# Patient Record
Sex: Female | Born: 1960 | Race: White | Hispanic: No | Marital: Married | Smoking: Never smoker
Health system: Southern US, Community
[De-identification: ages and names within clinical notes are randomized; demographics above are authoritative.]

## PROBLEM LIST (undated history)

## (undated) HISTORY — PX: BREAST BIOPSY: SHX20

## (undated) HISTORY — PX: ABDOMINAL HYSTERECTOMY: SHX81

---

## 2005-03-14 ENCOUNTER — Emergency Department: Payer: Self-pay | Admitting: Unknown Physician Specialty

## 2005-03-16 ENCOUNTER — Inpatient Hospital Stay: Payer: Self-pay | Admitting: Internal Medicine

## 2005-03-20 ENCOUNTER — Emergency Department (HOSPITAL_COMMUNITY): Admission: EM | Admit: 2005-03-20 | Discharge: 2005-03-20 | Payer: Self-pay | Admitting: Emergency Medicine

## 2005-03-24 ENCOUNTER — Encounter: Payer: Self-pay | Admitting: Internal Medicine

## 2005-03-31 ENCOUNTER — Encounter: Payer: Self-pay | Admitting: Internal Medicine

## 2011-05-29 ENCOUNTER — Ambulatory Visit: Payer: Self-pay | Admitting: Obstetrics & Gynecology

## 2011-06-17 ENCOUNTER — Ambulatory Visit: Payer: Self-pay | Admitting: Internal Medicine

## 2011-10-12 ENCOUNTER — Ambulatory Visit: Payer: Self-pay | Admitting: Internal Medicine

## 2015-01-06 ENCOUNTER — Ambulatory Visit: Payer: Self-pay | Admitting: Internal Medicine

## 2015-11-12 ENCOUNTER — Other Ambulatory Visit: Payer: Self-pay | Admitting: Family Medicine

## 2015-11-12 DIAGNOSIS — Z1231 Encounter for screening mammogram for malignant neoplasm of breast: Secondary | ICD-10-CM

## 2015-11-14 ENCOUNTER — Ambulatory Visit
Admission: RE | Admit: 2015-11-14 | Discharge: 2015-11-14 | Disposition: A | Discharge status: Home / Self Care | Payer: 59 | Source: Ambulatory Visit | Attending: Family Medicine | Admitting: Family Medicine

## 2015-11-14 DIAGNOSIS — Z1231 Encounter for screening mammogram for malignant neoplasm of breast: Secondary | ICD-10-CM | POA: Insufficient documentation

## 2017-05-25 ENCOUNTER — Other Ambulatory Visit: Payer: Self-pay | Admitting: Family Medicine

## 2017-05-25 DIAGNOSIS — Z1231 Encounter for screening mammogram for malignant neoplasm of breast: Secondary | ICD-10-CM

## 2017-05-28 ENCOUNTER — Ambulatory Visit
Admission: RE | Admit: 2017-05-28 | Discharge: 2017-05-28 | Disposition: A | Discharge status: Home / Self Care | Payer: 59 | Source: Ambulatory Visit | Attending: Family Medicine | Admitting: Family Medicine

## 2017-05-28 DIAGNOSIS — Z1231 Encounter for screening mammogram for malignant neoplasm of breast: Secondary | ICD-10-CM | POA: Diagnosis present

## 2018-01-16 ENCOUNTER — Other Ambulatory Visit: Payer: Self-pay

## 2018-01-16 ENCOUNTER — Ambulatory Visit
Admission: EM | Admit: 2018-01-16 | Discharge: 2018-01-16 | Disposition: A | Discharge status: Home / Self Care | Payer: 59 | Attending: Emergency Medicine | Admitting: Emergency Medicine

## 2018-01-16 DIAGNOSIS — N3001 Acute cystitis with hematuria: Secondary | ICD-10-CM

## 2018-01-16 DIAGNOSIS — R310 Gross hematuria: Secondary | ICD-10-CM

## 2018-01-16 LAB — URINALYSIS, COMPLETE (UACMP) WITH MICROSCOPIC
BILIRUBIN URINE: NEGATIVE
GLUCOSE, UA: NEGATIVE mg/dL
Ketones, ur: NEGATIVE mg/dL
Leukocytes, UA: NEGATIVE
NITRITE: NEGATIVE
PROTEIN: 30 mg/dL — AB
SPECIFIC GRAVITY, URINE: 1.025 (ref 1.005–1.030)
pH: 5 (ref 5.0–8.0)

## 2018-01-16 MED ORDER — FLUCONAZOLE 150 MG PO TABS: 150.0000 mg | ORAL_TABLET | Freq: Once | ORAL | 1 refills | Status: AC

## 2018-01-16 MED ORDER — CEPHALEXIN 500 MG PO CAPS: 500.0000 mg | ORAL_CAPSULE | Freq: Two times a day (BID) | ORAL | 0 refills | Status: DC

## 2018-01-16 NOTE — ED Triage Notes (Signed)
Pt reports she noticed low back pain earlier in the week. Today she noticed blood in her urine.

## 2018-01-16 NOTE — ED Provider Notes (Signed)
HPI  SUBJECTIVE:  Tammy Campbell is Campbell 57 y.o. female who presents with 6-8 episodes of gross hematuria starting this morning.  She states that she has hematuria each time she urinates.  She denies passing clots.  She states that she had odorous urine at the beginning of the week and had some bilateral low back pain several days ago, but this has since resolved.  She states that she drinks lots of sodas and juices and not much water.  She denies dysuria, urgency, frequency, cloudy urine.  No fevers, body aches, abdominal, flank, pelvic pain.  No vaginal bleeding, itching, odor, rash, discharge.  She has not had intercourse with her husband in months.  STDs are not Campbell concern today.  She denies epistaxis, melena, hematochezia, easy bruising.  No antibiotics in the past month.  No antipyretic in the past 6-8 hours.  No aggravating or alleviating factors.  She has not tried anything for this.  She has Campbell past medical history of UTIs and states that she had hematuria with that.  She has Campbell past medical history of vaginal yeast infections.  No history of pyelonephritis, nephrolithiasis, STI's, BV, anticoagulant, antiplatelet use, coagulopathies.  Family history negative for nephrolithiasis.  DGL:OVFIEPPMD:George, Tammy CrosbySionne A, MD   History reviewed. No pertinent past medical history.  Past Surgical History:  Procedure Laterality Date  . ABDOMINAL HYSTERECTOMY    . BREAST BIOPSY Left    2 bx-neg    Family History  Problem Relation Age of Onset  . Breast cancer Neg Hx     Social History   Tobacco Use  . Smoking status: Never Smoker  . Smokeless tobacco: Never Used  Substance Use Topics  . Alcohol use: Yes    Frequency: Never    Comment: social  . Drug use: No    No current facility-administered medications for this encounter.   Current Outpatient Medications:  .  cephALEXin (KEFLEX) 500 MG capsule, Take 1 capsule (500 mg total) by mouth 2 (two) times daily., Disp: 14 capsule, Rfl: 0 .   fluconazole (DIFLUCAN) 150 MG tablet, Take 1 tablet (150 mg total) by mouth once for 1 dose. 1 tab po x 1. May repeat in 72 hours if no improvement, Disp: 2 tablet, Rfl: 1  Allergies  Allergen Reactions  . Penicillins Rash     ROS  As noted in HPI.   Physical Exam  BP (!) 112/58 (BP Location: Left Arm)   Pulse 67   Temp 98.2 F (36.8 C) (Oral)   Resp 20   Ht 5\' 5"  (1.651 m)   Wt 205 lb (93 kg)   SpO2 100%   BMI 34.11 kg/m   Constitutional: Well developed, well nourished, no acute distress Eyes:  EOMI, conjunctiva normal bilaterally HENT: Normocephalic, atraumatic,mucus membranes moist Respiratory: Normal inspiratory effort Cardiovascular: Normal rate GI: nondistended.  Positive suprapubic tenderness.  No flank tenderness.  Active bowel sounds.  No guarding, rebound. Back: No CVAT skin: No rash, skin intact Musculoskeletal: no deformities Neurologic: Alert & oriented x 3, no focal neuro deficits Psychiatric: Speech and behavior appropriate   ED Course   Medications - No data to display  Orders Placed This Encounter  Procedures  . Urine culture    Standing Status:   Standing    Number of Occurrences:   1    Order Specific Question:   List patient's active antibiotics    Answer:   keflex    Order Specific Question:   Patient immune  status    Answer:   Normal  . Urinalysis, Complete w Microscopic    Standing Status:   Standing    Number of Occurrences:   1    Results for orders placed or performed during the hospital encounter of 01/16/18 (from the past 24 hour(s))  Urinalysis, Complete w Microscopic     Status: Abnormal   Collection Time: 01/16/18 11:05 AM  Result Value Ref Range   Color, Urine AMBER (Campbell) YELLOW   APPearance CLOUDY (Campbell) CLEAR   Specific Gravity, Urine 1.025 1.005 - 1.030   pH 5.0 5.0 - 8.0   Glucose, UA NEGATIVE NEGATIVE mg/dL   Hgb urine dipstick LARGE (Campbell) NEGATIVE   Bilirubin Urine NEGATIVE NEGATIVE   Ketones, ur NEGATIVE NEGATIVE  mg/dL   Protein, ur 30 (Campbell) NEGATIVE mg/dL   Nitrite NEGATIVE NEGATIVE   Leukocytes, UA NEGATIVE NEGATIVE   Squamous Epithelial / LPF 0-5 (Campbell) NONE SEEN   WBC, UA 0-5 0 - 5 WBC/hpf   RBC / HPF TOO NUMEROUS TO COUNT 0 - 5 RBC/hpf   Bacteria, UA FEW (Campbell) NONE SEEN   Budding Yeast MANY    No results found.  ED Clinical Impression  Gross hematuria  Acute cystitis with hematuria   ED Assessment/Plan   Large hematuria, some proteinuria, positive yeast.  Few bacteria, but negative nitrites and esterase.  This could be nephrolithiasis given that she had back pain several days ago, but it is since resolved so doubt obstructing nephrolithiasis.  We will treat her as if this is Campbell UTI with some Keflex and refer her to urology for Campbell comprehensive workup.  Dr. Alvester Morin on call.  Sending urine off for culture to confirm antibiotic choice.   Will also treat the yeast in her urine with Diflucan although she has no vaginal symptoms.  She states that she had an unknown rash to penicillin over 10 years ago.  She denies anaphylaxis, shortness of breath.  Discussed with her the small possibility of cross reactivity between penicillin and Keflex however she has agreed to try it.  Discussed labs,  MDM, plan and followup with patient. Discussed sn/sx that should prompt return to the ED. patient agrees with plan.   Meds ordered this encounter  Medications  . cephALEXin (KEFLEX) 500 MG capsule    Sig: Take 1 capsule (500 mg total) by mouth 2 (two) times daily.    Dispense:  14 capsule    Refill:  0  . fluconazole (DIFLUCAN) 150 MG tablet    Sig: Take 1 tablet (150 mg total) by mouth once for 1 dose. 1 tab po x 1. May repeat in 72 hours if no improvement    Dispense:  2 tablet    Refill:  1    *This clinic note was created using Scientist, clinical (histocompatibility and immunogenetics). Therefore, there may be occasional mistakes despite careful proofreading.   ?   Domenick Gong, MD 01/16/18 1212

## 2018-01-16 NOTE — Discharge Instructions (Signed)
We are going to treat this as if this is a urinary tract infection with Keflex.  I am also going to treat the yeast with Diflucan.  Take 1 pill today and you may repeat it in 72 hours if you are having vaginal itching, discharge.  You may take 600 mg ibuprofen with 1 g of Tylenol as needed for pain.  Follow-up with Dr. Alvester MorinBell in a week for a more comprehensive workup.  While this may be coming from a urinary tract infection we need to make sure that it is not a serious cause, such as kidney damage or bladder cancer.

## 2018-01-18 LAB — URINE CULTURE
Culture: <10000 — AB
SPECIAL REQUESTS: NORMAL

## 2018-07-19 ENCOUNTER — Other Ambulatory Visit: Payer: Self-pay | Admitting: Family Medicine

## 2018-07-19 DIAGNOSIS — Z1231 Encounter for screening mammogram for malignant neoplasm of breast: Secondary | ICD-10-CM

## 2018-07-20 ENCOUNTER — Ambulatory Visit
Admission: RE | Admit: 2018-07-20 | Discharge: 2018-07-20 | Disposition: A | Discharge status: Home / Self Care | Payer: BLUE CROSS/BLUE SHIELD | Source: Ambulatory Visit | Attending: Family Medicine | Admitting: Family Medicine

## 2018-07-20 ENCOUNTER — Encounter (INDEPENDENT_AMBULATORY_CARE_PROVIDER_SITE_OTHER): Payer: Self-pay

## 2018-07-20 DIAGNOSIS — Z1231 Encounter for screening mammogram for malignant neoplasm of breast: Secondary | ICD-10-CM | POA: Insufficient documentation

## 2018-08-10 ENCOUNTER — Encounter: Payer: Self-pay | Admitting: Anesthesiology

## 2018-08-18 ENCOUNTER — Ambulatory Visit: Payer: BLUE CROSS/BLUE SHIELD | Admitting: Anesthesiology

## 2018-08-18 ENCOUNTER — Ambulatory Visit
Admission: RE | Admit: 2018-08-18 | Discharge: 2018-08-18 | Disposition: A | Discharge status: Home / Self Care | Payer: BLUE CROSS/BLUE SHIELD | Source: Ambulatory Visit | Attending: Otolaryngology | Admitting: Otolaryngology

## 2018-08-18 ENCOUNTER — Encounter
Admission: RE | Disposition: A | Discharge status: Home / Self Care | Payer: Self-pay | Source: Ambulatory Visit | Attending: Otolaryngology

## 2018-08-18 DIAGNOSIS — Z6836 Body mass index (BMI) 36.0-36.9, adult: Secondary | ICD-10-CM | POA: Diagnosis not present

## 2018-08-18 DIAGNOSIS — J351 Hypertrophy of tonsils: Secondary | ICD-10-CM | POA: Diagnosis present

## 2018-08-18 DIAGNOSIS — J3501 Chronic tonsillitis: Secondary | ICD-10-CM | POA: Diagnosis not present

## 2018-08-18 HISTORY — PX: TONSILLECTOMY: SHX5217

## 2018-08-18 SURGERY — TONSILLECTOMY
Anesthesia: General | Site: Throat | Laterality: Bilateral

## 2018-08-18 MED ORDER — LIDOCAINE HCL (CARDIAC) PF 100 MG/5ML IV SOSY
PREFILLED_SYRINGE | INTRAVENOUS | Status: DC | PRN
  Administered 2018-08-18: 40 mg via INTRAVENOUS

## 2018-08-18 MED ORDER — ACETAMINOPHEN 10 MG/ML IV SOLN: 1000.0000 mg | Freq: Once | INTRAVENOUS | Status: DC

## 2018-08-18 MED ORDER — OXYCODONE HCL 5 MG/5ML PO SOLN
5.0000 mg | Freq: Once | ORAL | Status: AC | PRN
  Administered 2018-08-18: 5 mg via ORAL

## 2018-08-18 MED ORDER — SCOPOLAMINE 1 MG/3DAYS TD PT72
1.0000 | MEDICATED_PATCH | Freq: Once | TRANSDERMAL | Status: DC
  Administered 2018-08-18: 1.5 mg via TRANSDERMAL

## 2018-08-18 MED ORDER — OXYMETAZOLINE HCL 0.05 % NA SOLN
NASAL | Status: DC | PRN
  Administered 2018-08-18: 1 via TOPICAL

## 2018-08-18 MED ORDER — FENTANYL CITRATE (PF) 100 MCG/2ML IJ SOLN
25.0000 ug | INTRAMUSCULAR | Status: DC | PRN
  Administered 2018-08-18: 25 ug via INTRAVENOUS

## 2018-08-18 MED ORDER — SUCCINYLCHOLINE CHLORIDE 20 MG/ML IJ SOLN
INTRAMUSCULAR | Status: DC | PRN
  Administered 2018-08-18: 80 mg via INTRAVENOUS

## 2018-08-18 MED ORDER — ONDANSETRON HCL 4 MG PO TABS: 4.0000 mg | ORAL_TABLET | Freq: Three times a day (TID) | ORAL | 0 refills | Status: AC | PRN

## 2018-08-18 MED ORDER — MIDAZOLAM HCL 5 MG/5ML IJ SOLN
INTRAMUSCULAR | Status: DC | PRN
  Administered 2018-08-18: 2 mg via INTRAVENOUS

## 2018-08-18 MED ORDER — LACTATED RINGERS IV SOLN
10.0000 mL/h | INTRAVENOUS | Status: DC
  Administered 2018-08-18: 10 mL/h via INTRAVENOUS

## 2018-08-18 MED ORDER — PROMETHAZINE HCL 25 MG/ML IJ SOLN: 6.2500 mg | INTRAMUSCULAR | Status: DC | PRN

## 2018-08-18 MED ORDER — ONDANSETRON HCL 4 MG/2ML IJ SOLN
INTRAMUSCULAR | Status: DC | PRN
  Administered 2018-08-18: 4 mg via INTRAVENOUS

## 2018-08-18 MED ORDER — DEXAMETHASONE SODIUM PHOSPHATE 4 MG/ML IJ SOLN
INTRAMUSCULAR | Status: DC | PRN
  Administered 2018-08-18: 10 mg via INTRAVENOUS

## 2018-08-18 MED ORDER — PROPOFOL 10 MG/ML IV BOLUS
INTRAVENOUS | Status: DC | PRN
  Administered 2018-08-18: 130 mg via INTRAVENOUS

## 2018-08-18 MED ORDER — FENTANYL CITRATE (PF) 100 MCG/2ML IJ SOLN
INTRAMUSCULAR | Status: DC | PRN
  Administered 2018-08-18 (×2): 25 ug via INTRAVENOUS
  Administered 2018-08-18: 50 ug via INTRAVENOUS

## 2018-08-18 MED ORDER — GLYCOPYRROLATE 0.2 MG/ML IJ SOLN
INTRAMUSCULAR | Status: DC | PRN
  Administered 2018-08-18: 0.1 mg via INTRAVENOUS

## 2018-08-18 MED ORDER — ACETAMINOPHEN 10 MG/ML IV SOLN
1000.0000 mg | Freq: Once | INTRAVENOUS | Status: AC
  Administered 2018-08-18: 1000 mg via INTRAVENOUS

## 2018-08-18 MED ORDER — OXYCODONE HCL 5 MG PO TABS: 5.0000 mg | ORAL_TABLET | Freq: Once | ORAL | Status: AC | PRN

## 2018-08-18 MED ORDER — OXYCODONE HCL 5 MG/5ML PO SOLN: 5.0000 mg | ORAL | 0 refills | Status: AC | PRN

## 2018-08-18 MED ORDER — LIDOCAINE VISCOUS HCL 2 % MT SOLN: 10.0000 mL | Freq: Four times a day (QID) | OROMUCOSAL | 0 refills | Status: AC | PRN

## 2018-08-18 MED ORDER — BUPIVACAINE HCL (PF) 0.25 % IJ SOLN
INTRAMUSCULAR | Status: DC | PRN
  Administered 2018-08-18: 1 mL

## 2018-08-18 SURGICAL SUPPLY — 20 items
BLADE BOVIE TIP EXT 4 (BLADE) ×3 IMPLANT
CANISTER SUCT 1200ML W/VALVE (MISCELLANEOUS) ×3 IMPLANT
CATH ROBINSON RED A/P 10FR (CATHETERS) ×3 IMPLANT
COAG SUCT 10F 3.5MM HAND CTRL (MISCELLANEOUS) ×3 IMPLANT
ELECT REM PT RETURN 9FT ADLT (ELECTROSURGICAL) ×3
ELECTRODE REM PT RTRN 9FT ADLT (ELECTROSURGICAL) ×1 IMPLANT
GLOVE BIO SURGEON STRL SZ7.5 (GLOVE) ×5 IMPLANT
HANDLE SUCTION POOLE (INSTRUMENTS) ×1 IMPLANT
KIT TURNOVER KIT A (KITS) ×3 IMPLANT
NDL HYPO 25GX1X1/2 BEV (NEEDLE) ×1 IMPLANT
NEEDLE HYPO 25GX1X1/2 BEV (NEEDLE) ×3 IMPLANT
NS IRRIG 500ML POUR BTL (IV SOLUTION) ×3 IMPLANT
PACK TONSIL/ADENOIDS (PACKS) ×3 IMPLANT
PENCIL SMOKE EVACUATOR (MISCELLANEOUS) ×3 IMPLANT
SLEEVE SUCTION 125 (MISCELLANEOUS) ×3 IMPLANT
SOL ANTI-FOG 6CC FOG-OUT (MISCELLANEOUS) ×1 IMPLANT
SOL FOG-OUT ANTI-FOG 6CC (MISCELLANEOUS) ×2
STRAP BODY AND KNEE 60X3 (MISCELLANEOUS) ×5 IMPLANT
SUCTION POOLE HANDLE (INSTRUMENTS) ×3
SYR 5ML LL (SYRINGE) ×3 IMPLANT

## 2018-08-18 NOTE — H&P (Signed)
..  History and Physical paper copy reviewed and updated date of procedure and will be scanned into system.  Patient seen and examined.  

## 2018-08-18 NOTE — Anesthesia Procedure Notes (Signed)
Procedure Name: Intubation Date/Time: 08/18/2018 8:28 AM Performed by: Jimmy PicketAmyot, Aleeta Schmaltz, CRNA Pre-anesthesia Checklist: Patient identified, Emergency Drugs available, Suction available, Patient being monitored and Timeout performed Patient Re-evaluated:Patient Re-evaluated prior to induction Oxygen Delivery Method: Circle system utilized Preoxygenation: Pre-oxygenation with 100% oxygen Induction Type: IV induction Ventilation: Mask ventilation without difficulty Laryngoscope Size: Miller and 2 Grade View: Grade I Tube type: Oral Rae Tube size: 7.0 mm Number of attempts: 1 Placement Confirmation: ETT inserted through vocal cords under direct vision,  positive ETCO2 and breath sounds checked- equal and bilateral Tube secured with: Tape Dental Injury: Teeth and Oropharynx as per pre-operative assessment

## 2018-08-18 NOTE — Discharge Instructions (Signed)
T & A INSTRUCTION SHEET - [facility] °[facility] ° °CREIGHTON VAUGHT, MD °PAUL H. JUENGEL, MD  °P. [facility]ENNETT °CHAPMAN MCQUEEN, MD ° °[address] [place], [place] [address] TEL. [phone] °[address] [place] [address] [phone] ° °INFORMATION SHEET FOR A TONSILLECTOMY AND ADENDOIDECTOMY ° °About Your Tonsils and Adenoids ° The tonsils and adenoids are normal body tissues that are part of our immune system.  They normally help to protect us against diseases that may enter our mouth and nose.  However, sometimes the tonsils and/or adenoids become too large and obstruct our breathing, especially at night. °  ° If either of these things happen it helps to remove the tonsils and adenoids in order to become healthier. The operation to remove the tonsils and adenoids is called a tonsillectomy and adenoidectomy. ° °The Location of Your Tonsils and Adenoids ° The tonsils are located in the back of the throat on both side and sit in a cradle of muscles. The adenoids are located in the roof of the mouth, behind the nose, and closely associated with the opening of the Eustachian tube to the ear. ° °Surgery on Tonsils and Adenoids ° A tonsillectomy and adenoidectomy is a short operation which takes about thirty minutes.  This includes being put to sleep and being awakened.  Tonsillectomies and adenoidectomies are performed at [facility] and may require observation period in the recovery room prior to going home. ° °Following the Operation for a Tonsillectomy ° A cautery machine is used to control bleeding.  Bleeding from a tonsillectomy and adenoidectomy is minimal and postoperatively the risk of bleeding is approximately four percent, although this rarely life threatening. ° ° ° °After your tonsillectomy and adenoidectomy post-op care at home: ° °1. Our patients are able to go home the same day.  You may be given prescriptions for pain  medications and antibiotics, if indicated. °2. It is extremely important to remember that fluid intake is of utmost importance after a tonsillectomy.  The amount that you drink must be maintained in the postoperative period.  A good indication of whether a child is getting enough fluid is whether his/her urine output is constant.  As long as children are urinating or wetting their diaper every 6 - 8 hours this is usually enough fluid intake.   °3. Although rare, this is a risk of some bleeding in the first ten days after surgery.  This is usually occurs between day five and nine postoperatively.  This risk of bleeding is approximately four percent.  If you or your child should have any bleeding you should remain calm and notify our office or go directly to the Emergency Room at [facility] where they will contact us. Our doctors are available seven days a week for notification.  We recommend sitting up quietly in a chair, place an ice pack on the front of the neck and spitting out the blood gently until we are able to contact you.  Adults should gargle gently with ice water and this may help stop the bleeding.  If the bleeding does not stop after a short time, i.e. 10 to 15 minutes, or seems to be increasing again, please contact us or go to the hospital.   °4. It is common for the pain to be worse at 5 - 7 days postoperatively.  This occurs because the “scab” is peeling off and the mucous membrane (skin of   the throat) is growing back where the tonsils were.   °5. It is common for a low-grade fever, less than 102, during the first week after a tonsillectomy and adenoidectomy.  It is usually due to not drinking enough liquids, and we suggest your use liquid Tylenol or the pain medicine with Tylenol prescribed in order to keep your temperature below 102.  Please follow the directions on the back of the bottle. °6. Do not take aspirin or any products that contain aspirin such as Bufferin, Anacin,  Ecotrin, aspirin gum, Goodies, BC headache powders, etc., after a T&A because it can promote bleeding.  Please check with our office before administering any other medication that may been prescribed by other doctors during the two week post-operative period. °7. If you happen to look in the mirror or into your child’s mouth you will see white/gray patches on the back of the throat.  This is what a scab looks like in the mouth and is normal after having a T&A.  It will disappear once the tonsil area heals completely. However, it may cause a noticeable odor, and this too will disappear with time.     °8. You or your child may experience ear pain after having a T&A.  This is called referred pain and comes from the throat, but it is felt in the ears.  Ear pain is quite common and expected.  It will usually go away after ten days.  There is usually nothing wrong with the ears, and it is primarily due to the healing area stimulating the nerve to the ear that runs along the side of the throat.  Use either the prescribed pain medicine or Tylenol as needed.  °9. The throat tissues after a tonsillectomy are obviously sensitive.  Smoking around children who have had a tonsillectomy significantly increases the risk of bleeding.  DO NOT SMOKE!  ° °Scopolamine skin patches °REMOVE PATCH IN 72 HOURS AND WASH HANDS IMMEDIATELY °What is this medicine? °SCOPOLAMINE (skoe POL a meen) is used to prevent nausea and vomiting caused by motion sickness, anesthesia and surgery. °This medicine may be used for other purposes; ask your health care provider or pharmacist if you have questions. °COMMON BRAND NAME(S): Transderm Scop °What should I tell my health care provider before I take this medicine? °They need to know if you have any of these conditions: °-glaucoma °-kidney or liver disease °-an unusual or allergic reaction (especially skin allergy) to scopolamine, atropine, other medicines, foods, dyes, or preservatives °-pregnant or  trying to get pregnant °-breast-feeding °How should I use this medicine? °This medicine is for external use only. Follow the directions on the prescription label. One patch contains enough medicine to prevent motion sickness for up to 3 days. Apply the patch at least 4 hours before you need it and only wear one disc at a time. Choose an area behind the ear, that is clean, dry, hairless and free from any cuts or irritation. Wipe the area with a clean dry tissue. Peel off the plastic backing of the skin patch, trying not to touch the adhesive side with your hands. Do not cut the patches. Firmly apply to the area you have chosen, with the metallic side of the patch to the skin and the tan-colored side showing. Once firmly in place, wash your hands well with soap and water. Remove the disc after 3 days, or sooner if you no longer need it. After removing the patch, wash your hands and the area behind   your ear thoroughly with soap and water. The patch will still contain some medicine after use. To avoid accidental contact or ingestion by children or pets, fold the used patch in half with the sticky side together and throw away in the trash out of the reach of children and pets. If you need to use a second patch after you remove the first, place it behind the other ear. °Talk to your pediatrician regarding the use of this medicine in children. Special care may be needed. °Overdosage: If you think you have taken too much of this medicine contact a poison control center or emergency room at once. °NOTE: This medicine is only for you. Do not share this medicine with others. °What if I miss a dose? °Make sure you apply the patch at least 4 hours before you need it. You can apply it the night before traveling. °What may interact with this medicine? °-benztropine °-bethanechol °-medicines for anxiety or sleeping problems like diazepam or temazepam °-medicines for hay fever and other allergies °-medicines for mental  depression °-muscle relaxants °This list may not describe all possible interactions. Give your health care provider a list of all the medicines, herbs, non-prescription drugs, or dietary supplements you use. Also tell them if you smoke, drink alcohol, or use illegal drugs. Some items may interact with your medicine. °What should I watch for while using this medicine? °Keep the patch dry, if possible, to prevent it from falling off. Limited contact with water, however, as in bathing or swimming, will not affect the system. If the patch falls off, throw it away and put a new one behind the other ear. °You may get drowsy or dizzy. Do not drive, use machinery, or do anything that needs mental alertness until you know how this medicine affects you. Do not stand or sit up quickly, especially if you are an older patient. This reduces the risk of dizzy or fainting spells. Alcohol may interfere with the effect of this medicine. Avoid alcoholic drinks. °Your mouth may get dry. Chewing sugarless gum or sucking hard candy, and drinking plenty of water may help. Contact your doctor if the problem does not go away or is severe. °This medicine may cause dry eyes and blurred vision. If you wear contact lenses you may feel some discomfort. Lubricating drops may help. See your eye doctor if the problem does not go away or is severe. °If you are going to have a magnetic resonance imaging (MRI) procedure, tell your MRI technician if you have this patch on your body. It must be removed before a MRI. °What side effects may I notice from receiving this medicine? °Side effects that you should report to your doctor or health care professional as soon as possible: °-agitation, nervousness, confusion °-blurred vision and other eye problems °-dizziness, drowsiness °-eye pain or redness in the whites of the eye °-hallucinations °-pain or difficulty passing urine °-skin rash, itching °-vomiting °Side effects that usually do not require medical  attention (report to your doctor or health care professional if they continue or are bothersome): °-headache °-nausea °This list may not describe all possible side effects. Call your doctor for medical advice about side effects. You may report side effects to FDA at 1-800-FDA-1088. °Where should I keep my medicine? °Keep out of the reach of children. °Store at room temperature between 20 and 25 degrees C (68 and 77 degrees F). Throw away any unused medicine after the expiration date. When you remove a patch, fold it and throw   it in the trash as described above. °NOTE: This sheet is a summary. It may not cover all possible information. If you have questions about this medicine, talk to your doctor, pharmacist, or health care provider. °© 2018 Elsevier/Gold Standard (2012-04-15 13:31:48) ° ° °General Anesthesia, Adult, Care After °These instructions provide you with information about caring for yourself after your procedure. Your health care provider may also give you more specific instructions. Your treatment has been planned according to current medical practices, but problems sometimes occur. Call your health care provider if you have any problems or questions after your procedure. °What can I expect after the procedure? °After the procedure, it is common to have: °· Vomiting. °· A sore throat. °· Mental slowness. ° °It is common to feel: °· Nauseous. °· Cold or shivery. °· Sleepy. °· Tired. °· Sore or achy, even in parts of your body where you did not have surgery. ° °Follow these instructions at home: °For at least 24 hours after the procedure: °· Do not: °? Participate in activities where you could fall or become injured. °? Drive. °? Use heavy machinery. °? Drink alcohol. °? Take sleeping pills or medicines that cause drowsiness. °? Make important decisions or sign legal documents. °? Take care of children on your own. °· Rest. °Eating and drinking °· If you vomit, drink water, juice, or soup when you can drink  without vomiting. °· Drink enough fluid to keep your urine clear or pale yellow. °· Make sure you have little or no nausea before eating solid foods. °· Follow the diet recommended by your health care provider. °General instructions °· Have a responsible adult stay with you until you are awake and alert. °· Return to your normal activities as told by your health care provider. Ask your health care provider what activities are safe for you. °· Take over-the-counter and prescription medicines only as told by your health care provider. °· If you smoke, do not smoke without supervision. °· Keep all follow-up visits as told by your health care provider. This is important. °Contact a health care provider if: °· You continue to have nausea or vomiting at home, and medicines are not helpful. °· You cannot drink fluids or start eating again. °· You cannot urinate after 8-12 hours. °· You develop a skin rash. °· You have fever. °· You have increasing redness at the site of your procedure. °Get help right away if: °· You have difficulty breathing. °· You have chest pain. °· You have unexpected bleeding. °· You feel that you are having a life-threatening or urgent problem. °This information is not intended to replace advice given to you by your health care provider. Make sure you discuss any questions you have with your health care provider. °Document Released: 02/23/2001 Document Revised: 04/21/2016 Document Reviewed: 11/01/2015 °Elsevier Interactive Patient Education © 2018 Elsevier Inc. ° °

## 2018-08-18 NOTE — Anesthesia Postprocedure Evaluation (Signed)
Anesthesia Post Note  Patient: Tammy Campbell  Procedure(s) Performed: TONSILLECTOMY (Bilateral Throat)  Patient location during evaluation: PACU Anesthesia Type: General Level of consciousness: awake and alert and oriented Pain management: satisfactory to patient Vital Signs Assessment: post-procedure vital signs reviewed and stable Respiratory status: spontaneous breathing, nonlabored ventilation and respiratory function stable Cardiovascular status: blood pressure returned to baseline and stable Postop Assessment: Adequate PO intake and No signs of nausea or vomiting Anesthetic complications: no    Cherly BeachStella, Mariaha Ellington J

## 2018-08-18 NOTE — Anesthesia Preprocedure Evaluation (Signed)
Anesthesia Evaluation  Patient identified by MRN, date of birth, ID band Patient awake    Reviewed: Allergy & Precautions, H&P , NPO status , Patient's Chart, lab work & pertinent test results  Airway Mallampati: II  TM Distance: >3 FB Neck ROM: full    Dental no notable dental hx.    Pulmonary    Pulmonary exam normal breath sounds clear to auscultation       Cardiovascular Normal cardiovascular exam Rhythm:regular Rate:Normal     Neuro/Psych    GI/Hepatic   Endo/Other  Morbid obesity  Renal/GU      Musculoskeletal   Abdominal   Peds  Hematology   Anesthesia Other Findings   Reproductive/Obstetrics                             Anesthesia Physical Anesthesia Plan  ASA: II  Anesthesia Plan: General ETT   Post-op Pain Management:    Induction:   PONV Risk Score and Plan: 3 and Ondansetron, Dexamethasone, Scopolamine patch - Pre-op and Treatment may vary due to age or medical condition  Airway Management Planned:   Additional Equipment:   Intra-op Plan:   Post-operative Plan:   Informed Consent: I have reviewed the patients History and Physical, chart, labs and discussed the procedure including the risks, benefits and alternatives for the proposed anesthesia with the patient or authorized representative who has indicated his/her understanding and acceptance.     Plan Discussed with: CRNA  Anesthesia Plan Comments:         Anesthesia Quick Evaluation

## 2018-08-18 NOTE — Transfer of Care (Signed)
Immediate Anesthesia Transfer of Care Note  [facility]  Procedure(s) Performed: TONSILLECTOMY (Bilateral Throat)  Patient Location: PACU  Anesthesia Type: General ETT  Level of Consciousness: awake, alert  and patient cooperative  Airway and Oxygen Therapy: Patient Spontanous Breathing and Patient connected to supplemental oxygen  Post-op Assessment: Post-op Vital signs reviewed, Patient's Cardiovascular Status Stable, Respiratory Function Stable, Patent Airway and No signs of Nausea or vomiting  Post-op Vital Signs: Reviewed and stable  Complications: No apparent anesthesia complications

## 2018-08-18 NOTE — Op Note (Signed)
..  08/18/2018  9:07 AM    Franz DellPennington Weisner, Claris CheMargaret  [id number]   Pre-Op Dx:  TONSILLAR HYPERTROPHY TONSILLITIS  Post-op Dx: TONSILLAR HYPERTROPHY TONSILLITIS  Proc:Tonsillectomy > age 57  Surg: Arilynn Blakeney  Anes:  General Endotracheal  EBL:  10ml  Comp:  None  Findings:  4+ cryptic and erythematous tonsils with tonsillolithiasis  Procedure: After the patient was identified in holding and the history and physical and consent was reviewed, the patient was taken to the operating room and placed in a supine position.  General endotracheal anesthesia was induced in the normal fashion.  At this time, the patient was rotated 45 degrees and a shoulder roll was placed.  At this time, a McIvor mouthgag was inserted into the patient's oral cavity and suspended from the Mayo stand without injury to teeth, lips, or gums.  Next a red rubber catheter was inserted into the patient left nostril for retraction of the uvula and soft palate superiorly.  Next a curved Alice clamp was attached to the patient's right superior tonsillar pole and retracted medially and inferiorly.  A Bovie electrocautery was used to dissect the patient's right tonsil in a subcapsular plane.  Meticulous hemostasis was achieved with Bovie suction cautery.  At this time, the mouth gag was released from suspension for 1 minute.  Attention now was directed to the patient's left side.  In a similar fashion the curved Alice clamp was attached to the superior pole and this was retracted medially and inferiorly and the tonsil was excised in a subcapsular plane with Bovie electrocautery.  After completion of the second tonsil, meticulous hemostasis was continued.  At this time, the patient's nasal cavity and oral cavity was irrigated with sterile saline.  One ml of 0.25% Marcaine was injected into the anterior and posterior tonsillar fossa bilaterally.  Following this  The care of patient was returned to anesthesia, awakened,  and transferred to recovery in stable condition.  Dispo:  PACU to home  Plan: Soft diet.  Limit exercise and strenuous activity for 2 weeks.  Fluid hydration  Recheck my office three weeks.   Annibelle Brazie 9:07 AM 08/18/2018

## 2018-08-19 ENCOUNTER — Encounter: Payer: Self-pay | Admitting: Otolaryngology

## 2018-08-24 LAB — SURGICAL PATHOLOGY

## 2020-02-28 ENCOUNTER — Other Ambulatory Visit: Payer: Self-pay | Admitting: Pediatrics

## 2020-02-28 DIAGNOSIS — Z1231 Encounter for screening mammogram for malignant neoplasm of breast: Secondary | ICD-10-CM

## 2020-04-22 IMAGING — MG MM DIGITAL SCREENING BILAT W/ TOMO W/ CAD
8 series · 8 of 24 positions shown · non-contrast
Comparison: Previous exam(s).

CLINICAL DATA: Screening.

EXAM:
DIGITAL SCREENING BILATERAL MAMMOGRAM WITH TOMO AND CAD

[L MLO synth-2D]
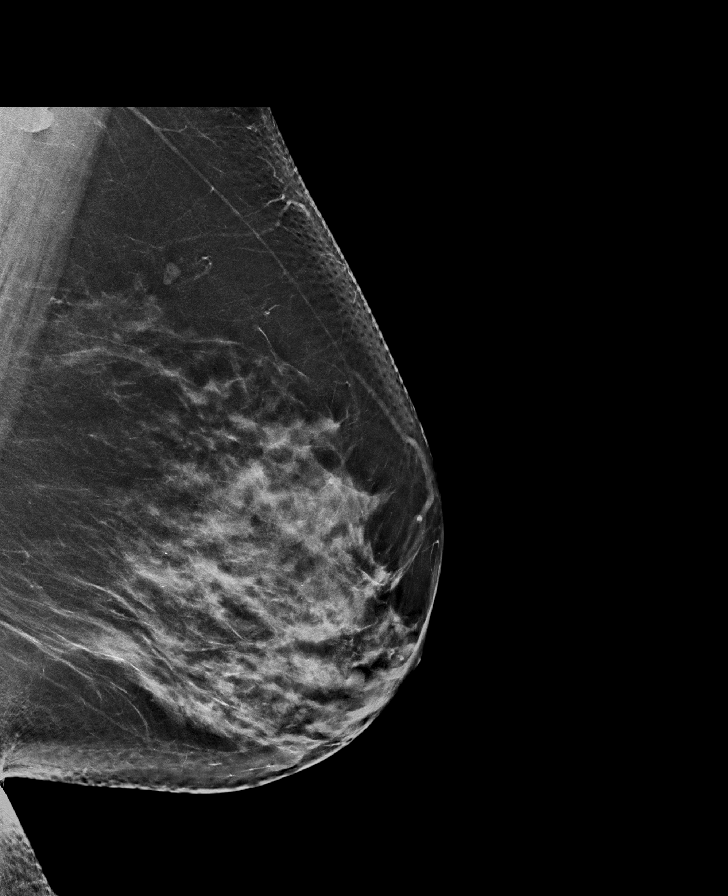

[R MLO synth-2D]
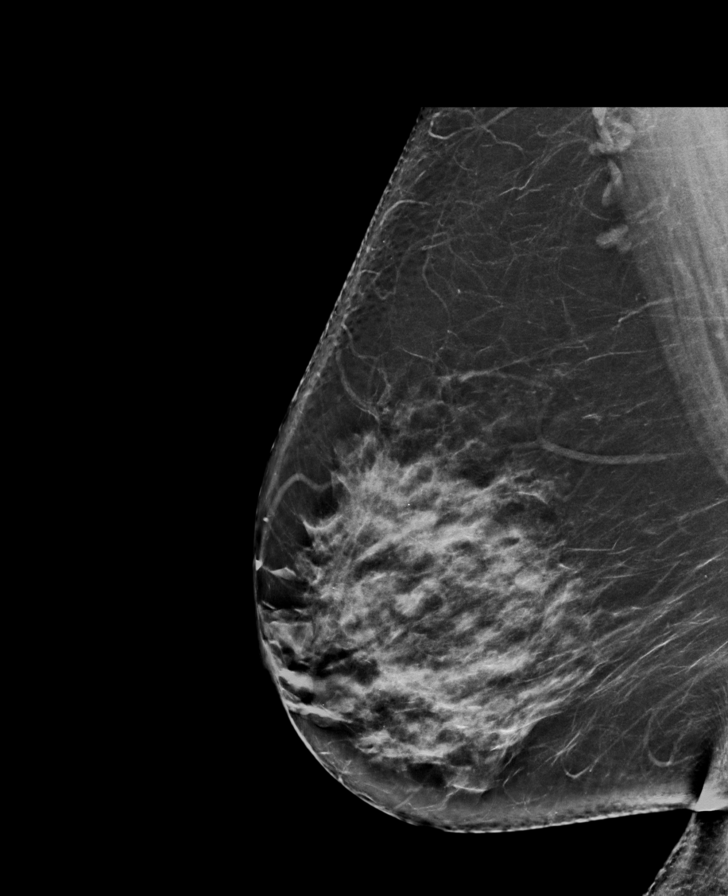

[R CC synth-2D]
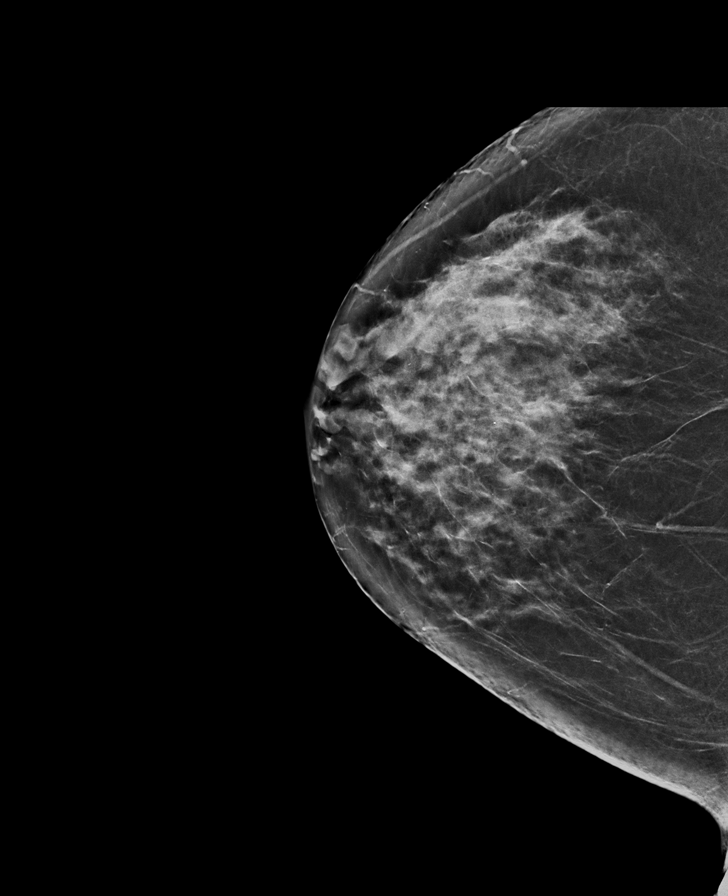

[L CC synth-2D]
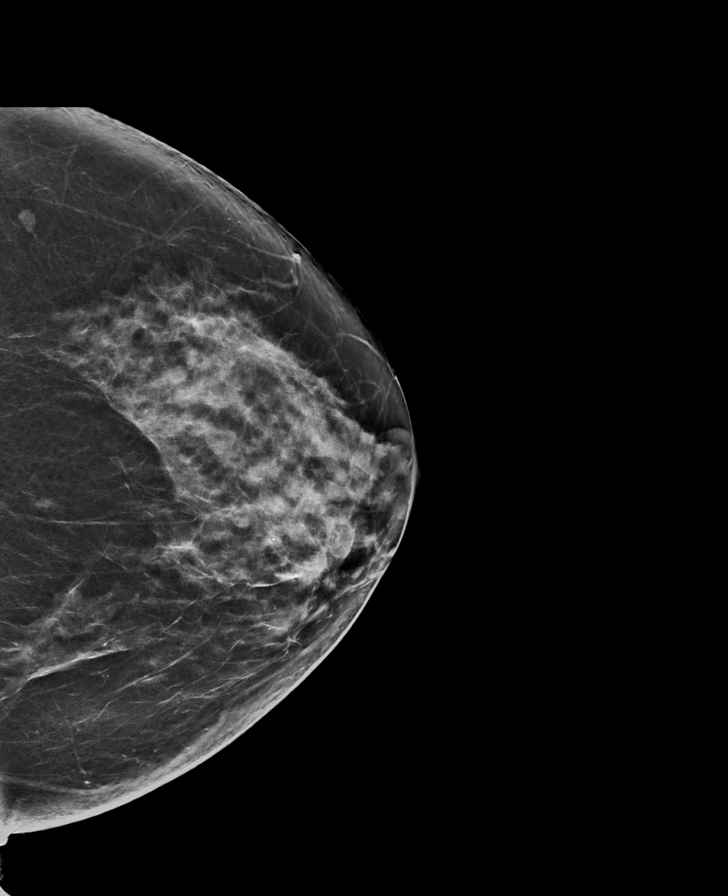

[R CC tomo · tomo slice 40/79.0]
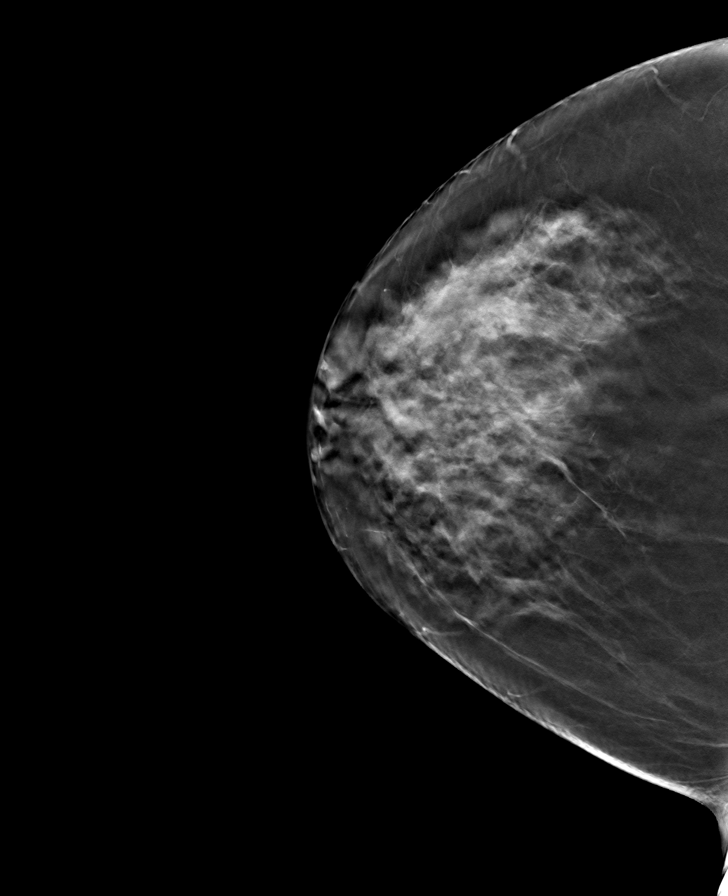

[L MLO tomo · tomo slice 43/84.0]
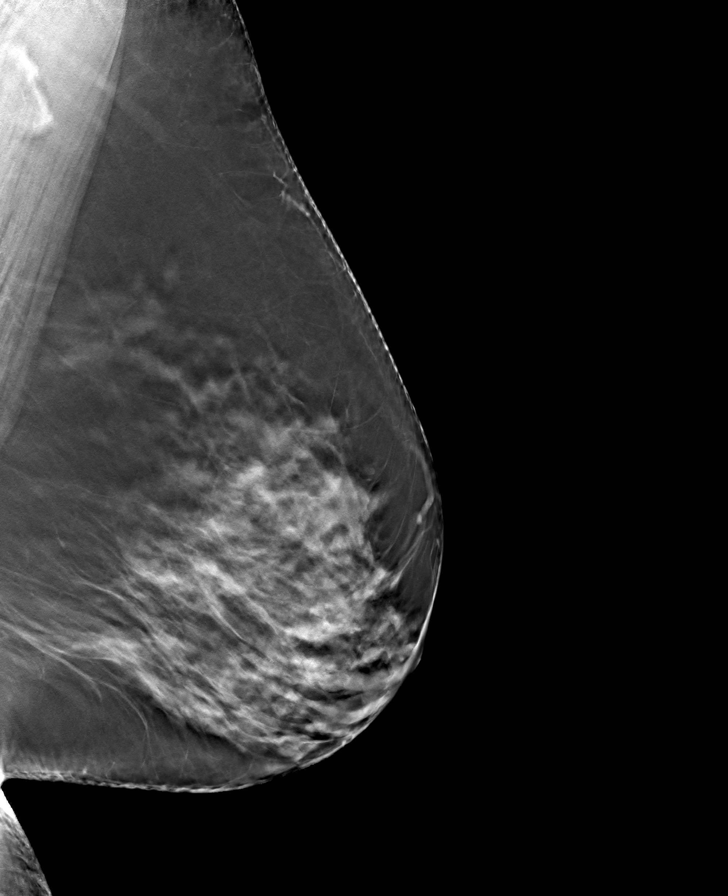

[L CC tomo · tomo slice 41/80.0]
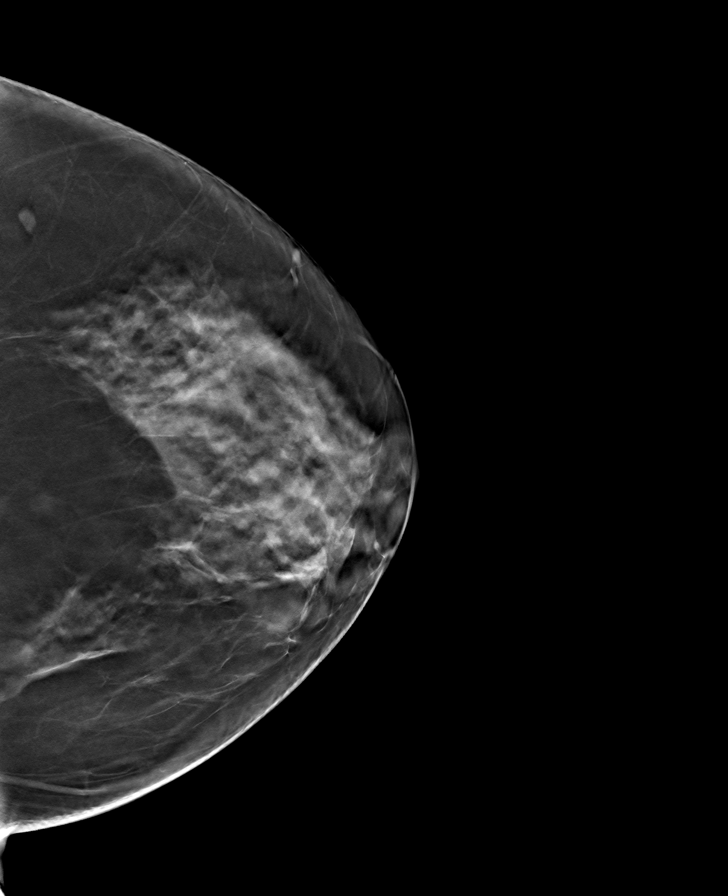

[R MLO tomo · tomo slice 43/85.0]
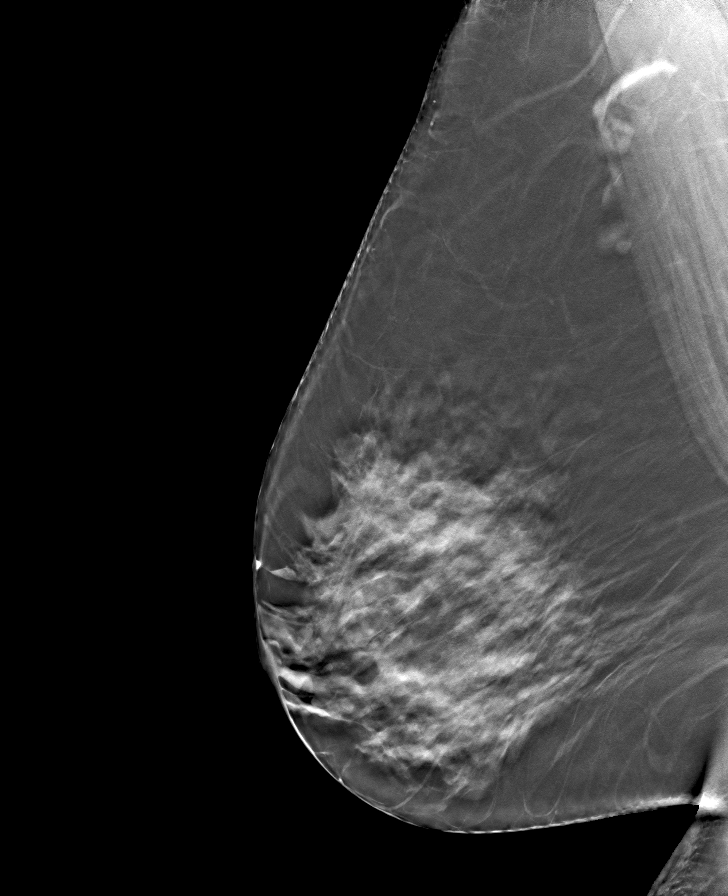

[8 of 24 positions shown; findings below may reference images not displayed]

ACR Breast Density Category c: The breast tissue is heterogeneously
dense, which may obscure small masses.
FINDINGS: There are no findings suspicious for malignancy. Images were
processed with CAD.
IMPRESSION: No mammographic evidence of malignancy. A result letter of this
screening mammogram will be mailed directly to the patient.

RECOMMENDATION:
Screening mammogram in one year. (Code:FT-U-LHB)

BI-RADS CATEGORY  1: Negative.

## 2020-09-11 ENCOUNTER — Other Ambulatory Visit: Payer: Self-pay

## 2020-09-11 ENCOUNTER — Ambulatory Visit
Admission: RE | Admit: 2020-09-11 | Discharge: 2020-09-11 | Disposition: A | Discharge status: Home / Self Care | Payer: BC Managed Care – PPO | Source: Ambulatory Visit | Attending: Pediatrics | Admitting: Pediatrics

## 2020-09-11 ENCOUNTER — Encounter (INDEPENDENT_AMBULATORY_CARE_PROVIDER_SITE_OTHER): Payer: Self-pay

## 2020-09-11 DIAGNOSIS — Z1231 Encounter for screening mammogram for malignant neoplasm of breast: Secondary | ICD-10-CM | POA: Diagnosis present

## 2020-09-17 ENCOUNTER — Other Ambulatory Visit: Payer: Self-pay | Admitting: Pediatrics

## 2020-09-17 DIAGNOSIS — R928 Other abnormal and inconclusive findings on diagnostic imaging of breast: Secondary | ICD-10-CM

## 2020-09-17 DIAGNOSIS — N6310 Unspecified lump in the right breast, unspecified quadrant: Secondary | ICD-10-CM

## 2020-09-28 ENCOUNTER — Other Ambulatory Visit: Payer: Self-pay

## 2020-09-28 ENCOUNTER — Ambulatory Visit
Admission: RE | Admit: 2020-09-28 | Discharge: 2020-09-28 | Disposition: A | Discharge status: Home / Self Care | Payer: BC Managed Care – PPO | Source: Ambulatory Visit | Attending: Pediatrics | Admitting: Pediatrics

## 2020-09-28 DIAGNOSIS — N6310 Unspecified lump in the right breast, unspecified quadrant: Secondary | ICD-10-CM

## 2020-09-28 DIAGNOSIS — R928 Other abnormal and inconclusive findings on diagnostic imaging of breast: Secondary | ICD-10-CM | POA: Diagnosis not present

## 2021-02-11 ENCOUNTER — Other Ambulatory Visit: Payer: Self-pay | Admitting: Family Medicine

## 2021-02-11 DIAGNOSIS — N6001 Solitary cyst of right breast: Secondary | ICD-10-CM

## 2021-02-11 DIAGNOSIS — N644 Mastodynia: Secondary | ICD-10-CM

## 2021-11-04 ENCOUNTER — Ambulatory Visit
Admission: EM | Admit: 2021-11-04 | Discharge: 2021-11-04 | Disposition: A | Discharge status: Home / Self Care | Payer: BC Managed Care – PPO | Attending: Urgent Care | Admitting: Urgent Care

## 2021-11-04 ENCOUNTER — Encounter: Payer: Self-pay | Admitting: Emergency Medicine

## 2021-11-04 DIAGNOSIS — J018 Other acute sinusitis: Secondary | ICD-10-CM

## 2021-11-04 MED ORDER — CETIRIZINE HCL 10 MG PO TABS: 10.0000 mg | ORAL_TABLET | Freq: Every day | ORAL | 0 refills | Status: AC

## 2021-11-04 MED ORDER — DOXYCYCLINE HYCLATE 100 MG PO CAPS: 100.0000 mg | ORAL_CAPSULE | Freq: Two times a day (BID) | ORAL | 0 refills | Status: AC

## 2021-11-04 MED ORDER — BENZONATATE 100 MG PO CAPS: 100.0000 mg | ORAL_CAPSULE | Freq: Three times a day (TID) | ORAL | 0 refills | Status: DC | PRN

## 2021-11-04 MED ORDER — PSEUDOEPHEDRINE HCL 30 MG PO TABS: 30.0000 mg | ORAL_TABLET | Freq: Three times a day (TID) | ORAL | 0 refills | Status: AC | PRN

## 2021-11-04 MED ORDER — PROMETHAZINE-DM 6.25-15 MG/5ML PO SYRP: 5.0000 mL | ORAL_SOLUTION | Freq: Every evening | ORAL | 0 refills | Status: AC | PRN

## 2021-11-04 NOTE — ED Provider Notes (Signed)
  Vivien Rossetti   MRN: [id number] DOB: 1961-10-09  Subjective:   Tammy Campbell is a 60 y.o. female presenting for 2 week history of persistent sinus pressure, congestion.  Has been having intermittent coughing fits that end up hurting her sinuses and head.  No chest pain, shortness of breath or wheezing.  Did a COVID test and was negative.  No history of respiratory disorders.  Patient is otherwise healthy.  Denies taking chronic medications.  Allergies  Allergen Reactions   Penicillins Rash    History reviewed. No pertinent past medical history.   Past Surgical History:  Procedure Laterality Date   ABDOMINAL HYSTERECTOMY     BREAST BIOPSY Left    2 bx-neg   TONSILLECTOMY Bilateral 08/18/2018   Procedure: TONSILLECTOMY;  Surgeon: Bud Face, MD;  Location: [facility];  Service: ENT;  Laterality: Bilateral;    Family History  Problem Relation Age of Onset   Breast cancer Neg Hx     Social History   Tobacco Use   Smoking status: Never   Smokeless tobacco: Never  Vaping Use   Vaping Use: Never used  Substance Use Topics   Alcohol use: Yes    Comment: social   Drug use: No    ROS   Objective:   Vitals: BP 138/78   Pulse 82   Temp 98.2 F (36.8 C)   Resp 20   SpO2 98%   Physical Exam Constitutional:      General: She is not in acute distress.    Appearance: Normal appearance. She is well-developed. She is not ill-appearing, toxic-appearing or diaphoretic.  HENT:     Head: Normocephalic and atraumatic.     Right Ear: External ear normal.     Left Ear: External ear normal.     Nose: Congestion and rhinorrhea present.     Mouth/Throat:     Mouth: Mucous membranes are moist.  Eyes:     Extraocular Movements: Extraocular movements intact.     Pupils: Pupils are equal, round, and reactive to light.  Cardiovascular:     Rate and Rhythm: Normal rate and regular rhythm.     Pulses: Normal pulses.     Heart  sounds: Normal heart sounds. No murmur heard.   No friction rub. No gallop.  Pulmonary:     Effort: Pulmonary effort is normal. No respiratory distress.     Breath sounds: Normal breath sounds. No stridor. No wheezing, rhonchi or rales.  Skin:    General: Skin is warm and dry.     Findings: No rash.  Neurological:     Mental Status: She is alert and oriented to person, place, and time.  Psychiatric:        Mood and Affect: Mood normal.        Behavior: Behavior normal.        Thought Content: Thought content normal.    Assessment and Plan :   PDMP not reviewed this encounter.  1. Acute non-recurrent sinusitis of other sinus    Deferred imaging given clear cardiopulmonary exam, hemodynamically stable vital signs.  Will start empiric treatment for sinusitis with doxycycline given her penicillin allergy.  Recommended supportive care otherwise including the use of oral antihistamine, decongestant. Counseled patient on potential for adverse effects with medications prescribed/recommended today, ER and return-to-clinic precautions discussed, patient verbalized understanding.    Wallis Bamberg, [place] 11/04/21 1437

## 2021-11-04 NOTE — ED Triage Notes (Signed)
Pt here with cough and congestion with sinus pressure x 2 weeks. States she feels ok, but can't get rid of the sx.

## 2021-11-25 ENCOUNTER — Encounter: Payer: Self-pay | Admitting: Emergency Medicine

## 2021-11-25 ENCOUNTER — Ambulatory Visit
Admission: EM | Admit: 2021-11-25 | Discharge: 2021-11-25 | Disposition: A | Discharge status: Home / Self Care | Payer: BC Managed Care – PPO | Attending: Emergency Medicine | Admitting: Emergency Medicine

## 2021-11-25 ENCOUNTER — Other Ambulatory Visit: Payer: Self-pay

## 2021-11-25 DIAGNOSIS — B349 Viral infection, unspecified: Secondary | ICD-10-CM

## 2021-11-25 DIAGNOSIS — R051 Acute cough: Secondary | ICD-10-CM | POA: Diagnosis not present

## 2021-11-25 LAB — POCT INFLUENZA A/B
Influenza A, POC: NEGATIVE
Influenza B, POC: NEGATIVE

## 2021-11-25 LAB — POCT RAPID STREP A (OFFICE): Rapid Strep A Screen: NEGATIVE

## 2021-11-25 MED ORDER — BENZONATATE 100 MG PO CAPS: 100.0000 mg | ORAL_CAPSULE | Freq: Three times a day (TID) | ORAL | 0 refills | Status: AC | PRN

## 2021-11-25 NOTE — ED Triage Notes (Signed)
Pt c/o cough x 3 weeks. She also has HA and bodyaches that started yesterday.

## 2021-11-25 NOTE — ED Provider Notes (Signed)
[facility]    CSN: [id number] Arrival date & time: 11/25/21  1123      History   Chief Complaint Chief Complaint  Patient presents with   Cough   Headache   Generalized Body Aches    HPI Tammy Campbell is a 60 y.o. female.  Patient presents chills, headache, body aches, congestion, runny nose, sore throat since last night.  She also reports ongoing cough for 3-4 weeks.  No fever, rash, shortness of breath, vomiting, diarrhea, or other symptoms.  Patient was seen at this urgent care on 11/04/2021; diagnosed with acute sinusitis; treated with doxycycline, benzonatate, cetirizine, Promethazine DM, pseudoephedrine.  The history is provided by the patient and medical records.   History reviewed. No pertinent past medical history.  There are no problems to display for this patient.   Past Surgical History:  Procedure Laterality Date   ABDOMINAL HYSTERECTOMY     BREAST BIOPSY Left    2 bx-neg   TONSILLECTOMY Bilateral 08/18/2018   Procedure: TONSILLECTOMY;  Surgeon: Bud Face, MD;  Location: [facility];  Service: ENT;  Laterality: Bilateral;    OB History   No obstetric history on file.      Home Medications    Prior to Admission medications   Medication Sig Start Date End Date Taking? Authorizing Provider  benzonatate (TESSALON) 100 MG capsule Take 1 capsule (100 mg total) by mouth 3 (three) times daily as needed for cough. 11/25/21  Yes Mickie Bail, NP  acetaminophen (TYLENOL) 500 MG tablet Take 500 mg by mouth every 6 (six) hours as needed.    [provider]  cetirizine (ZYRTEC ALLERGY) 10 MG tablet Take 1 tablet (10 mg total) by mouth daily. 11/04/21   Wallis Bamberg, PA-C  doxycycline (VIBRAMYCIN) 100 MG capsule Take 1 capsule (100 mg total) by mouth 2 (two) times daily. 11/04/21   Wallis Bamberg, PA-C  lidocaine (XYLOCAINE) 2 % solution Use as directed 10 mLs in the mouth or throat every 6 (six) hours as needed for  mouth pain (Swish and spit). 08/18/18   Vaught, Roney Mans, MD  ondansetron (ZOFRAN) 4 MG tablet Take 1 tablet (4 mg total) by mouth every 8 (eight) hours as needed for nausea or vomiting. 08/18/18   Vaught, Roney Mans, MD  oxyCODONE (ROXICODONE) 5 MG/5ML solution Take 5 mLs (5 mg total) by mouth every 4 (four) hours as needed for severe pain. 08/18/18   Bud Face, MD  promethazine-dextromethorphan (PROMETHAZINE-DM) 6.25-15 MG/5ML syrup Take 5 mLs by mouth at bedtime as needed for cough. 11/04/21   Wallis Bamberg, PA-C  pseudoephedrine (SUDAFED) 30 MG tablet Take 1 tablet (30 mg total) by mouth every 8 (eight) hours as needed for congestion. 11/04/21   Wallis Bamberg, PA-C    Family History Family History  Problem Relation Age of Onset   Breast cancer Neg Hx     Social History Social History   Tobacco Use   Smoking status: Never   Smokeless tobacco: Never  Vaping Use   Vaping Use: Never used  Substance Use Topics   Alcohol use: Yes    Comment: social   Drug use: No     Allergies   Sulfamethoxazole-trimethoprim and Penicillins   Review of Systems Review of Systems  Constitutional:  Positive for chills. Negative for fever.  HENT:  Positive for congestion and rhinorrhea. Negative for ear pain and sore throat.   Respiratory:  Positive for cough. Negative for shortness of breath.   Cardiovascular:  Negative for chest pain and palpitations.  Gastrointestinal:  Negative for diarrhea and vomiting.  Skin:  Negative for color change and rash.  Neurological:  Positive for headaches. Negative for syncope.  All other systems reviewed and are negative.   Physical Exam Triage Vital Signs ED Triage Vitals  Enc Vitals Group     BP      Pulse      Resp      Temp      Temp src      SpO2      Weight      Height      Head Circumference      Peak Flow      Pain Score      Pain Loc      Pain Edu?      Excl. in [place]?    No data found.  Updated Vital Signs BP 129/79    Pulse 92     Temp 99 F (37.2 C) (Oral)    Resp 16    SpO2 96%   Visual Acuity Right Eye Distance:   Left Eye Distance:   Bilateral Distance:    Right Eye Near:   Left Eye Near:    Bilateral Near:     Physical Exam Vitals and nursing note reviewed.  Constitutional:      General: She is not in acute distress.    Appearance: She is well-developed.  HENT:     Left Ear: Tympanic membrane normal.     Nose: Rhinorrhea present.     Mouth/Throat:     Mouth: Mucous membranes are moist.     Pharynx: Oropharynx is clear.  Cardiovascular:     Rate and Rhythm: Normal rate and regular rhythm.     Heart sounds: Normal heart sounds.  Pulmonary:     Effort: Pulmonary effort is normal. No respiratory distress.     Breath sounds: Normal breath sounds.  Musculoskeletal:     Cervical back: Neck supple.  Skin:    General: Skin is warm and dry.  Neurological:     Mental Status: She is alert.  Psychiatric:        Mood and Affect: Mood normal.        Behavior: Behavior normal.     UC Treatments / Results  Labs (all labs ordered are listed, but only abnormal results are displayed) Labs Reviewed  NOVEL CORONAVIRUS, NAA  POCT INFLUENZA A/B  POCT RAPID STREP A (OFFICE)    EKG   Radiology No results found.  Procedures Procedures (including critical care time)  Medications Ordered in UC Medications - No data to display  Initial Impression / Assessment and Plan / UC Course  I have reviewed the triage vital signs and the nursing notes.  Pertinent labs & imaging results that were available during my care of the patient were reviewed by me and considered in my medical decision making (see chart for details).   Cough, viral illness.  Rapid flu negative.  Rapid strep negative.  COVID pending.  Instructed patient to self quarantine per CDC guidelines.  Treating cough with Tessalon Perles.  Discussed symptomatic treatment including Tylenol or ibuprofen, rest, hydration.  Instructed patient to follow  up with PCP if symptoms are not improving.  Patient agrees to plan of care.    Final Clinical Impressions(s) / UC Diagnoses   Final diagnoses:  Acute cough  Viral illness     Discharge Instructions      Your strep and  flu tests are negative.  Your COVID test is pending.  You should self quarantine until the test result is back.    Take Tylenol or ibuprofen as needed for fever or discomfort.  Rest and keep yourself hydrated.    Follow-up with your primary care provider if your symptoms are not improving.         ED Prescriptions     Medication Sig Dispense Auth. Provider   benzonatate (TESSALON) 100 MG capsule Take 1 capsule (100 mg total) by mouth 3 (three) times daily as needed for cough. 21 capsule Mickie Bail, NP      PDMP not reviewed this encounter.   Mickie Bail, NP 11/25/21 1436

## 2021-11-25 NOTE — Discharge Instructions (Addendum)
Your strep and flu tests are negative.   ° °Your COVID test is pending.  You should self quarantine until the test result is back.   ° °Take Tylenol or ibuprofen as needed for fever or discomfort.  Rest and keep yourself hydrated.   ° °Follow-up with your primary care provider if your symptoms are not improving.   ° ° °

## 2021-11-27 LAB — NOVEL CORONAVIRUS, NAA: SARS-CoV-2, NAA: DETECTED — AB

## 2021-11-27 LAB — SARS-COV-2, NAA 2 DAY TAT

## 2022-11-26 ENCOUNTER — Ambulatory Visit
Admission: EM | Admit: 2022-11-26 | Discharge: 2022-11-26 | Disposition: A | Discharge status: Home / Self Care | Payer: BC Managed Care – PPO | Attending: Urgent Care | Admitting: Urgent Care

## 2022-11-26 DIAGNOSIS — R6889 Other general symptoms and signs: Secondary | ICD-10-CM | POA: Diagnosis not present

## 2022-11-26 MED ORDER — OSELTAMIVIR PHOSPHATE 75 MG PO CAPS: 75.0000 mg | ORAL_CAPSULE | Freq: Two times a day (BID) | ORAL | 0 refills | Status: AC

## 2022-11-26 NOTE — ED Triage Notes (Signed)
Pt. Presents to UC w/ c/o a cough that causes burning in her chest. Pt. States the cough started today. Pt. Also states 2 days ago she had episodes of throwing up and diarrhea those symptoms have since resolved.

## 2022-11-26 NOTE — ED Provider Notes (Signed)
[facility]    CSN: [id number] Arrival date & time: 11/26/22  1748      History   Chief Complaint Chief Complaint  Patient presents with   Cough    HPI Tammy Campbell is a 61 y.o. female.    Cough   Patient presents to urgent care with complaint of cough with associated burning in the center of her chest.  She states the cough starting today.  Patient also endorses 2-day history of throwing up as well as diarrhea.  Vomiting and diarrhea now resolved.  History reviewed. No pertinent past medical history.  There are no problems to display for this patient.   Past Surgical History:  Procedure Laterality Date   ABDOMINAL HYSTERECTOMY     BREAST BIOPSY Left    2 bx-neg   TONSILLECTOMY Bilateral 08/18/2018   Procedure: TONSILLECTOMY;  Surgeon: Bud Face, MD;  Location: [facility];  Service: ENT;  Laterality: Bilateral;    OB History   No obstetric history on file.      Home Medications    Prior to Admission medications   Medication Sig Start Date End Date Taking? Authorizing Provider  acetaminophen (TYLENOL) 500 MG tablet Take 500 mg by mouth every 6 (six) hours as needed.    [provider]  benzonatate (TESSALON) 100 MG capsule Take 1 capsule (100 mg total) by mouth 3 (three) times daily as needed for cough. 11/25/21   Mickie Bail, NP  cetirizine (ZYRTEC ALLERGY) 10 MG tablet Take 1 tablet (10 mg total) by mouth daily. 11/04/21   Wallis Bamberg, PA-C  doxycycline (VIBRAMYCIN) 100 MG capsule Take 1 capsule (100 mg total) by mouth 2 (two) times daily. 11/04/21   Wallis Bamberg, PA-C  lidocaine (XYLOCAINE) 2 % solution Use as directed 10 mLs in the mouth or throat every 6 (six) hours as needed for mouth pain (Swish and spit). 08/18/18   Vaught, Roney Mans, MD  ondansetron (ZOFRAN) 4 MG tablet Take 1 tablet (4 mg total) by mouth every 8 (eight) hours as needed for nausea or vomiting. 08/18/18   Vaught, Roney Mans, MD   oxyCODONE (ROXICODONE) 5 MG/5ML solution Take 5 mLs (5 mg total) by mouth every 4 (four) hours as needed for severe pain. 08/18/18   Bud Face, MD  promethazine-dextromethorphan (PROMETHAZINE-DM) 6.25-15 MG/5ML syrup Take 5 mLs by mouth at bedtime as needed for cough. 11/04/21   Wallis Bamberg, PA-C  pseudoephedrine (SUDAFED) 30 MG tablet Take 1 tablet (30 mg total) by mouth every 8 (eight) hours as needed for congestion. 11/04/21   Wallis Bamberg, PA-C    Family History Family History  Problem Relation Age of Onset   Breast cancer Neg Hx     Social History Social History   Tobacco Use   Smoking status: Never   Smokeless tobacco: Never  Vaping Use   Vaping Use: Never used  Substance Use Topics   Alcohol use: Yes    Comment: social   Drug use: No     Allergies   Sulfamethoxazole-trimethoprim and Penicillins   Review of Systems Review of Systems  Respiratory:  Positive for cough.      Physical Exam Triage Vital Signs ED Triage Vitals [11/26/22 1900]  Enc Vitals Group     BP 124/77     Pulse Rate 76     Resp 17     Temp 97.7 F (36.5 C)     Temp src      SpO2 97 %  Weight      Height      Head Circumference      Peak Flow      Pain Score 0     Pain Loc      Pain Edu?      Excl. in [place]?    No data found.  Updated Vital Signs BP 124/77   Pulse 76   Temp 97.7 F (36.5 C)   Resp 17   SpO2 97%   Visual Acuity Right Eye Distance:   Left Eye Distance:   Bilateral Distance:    Right Eye Near:   Left Eye Near:    Bilateral Near:     Physical Exam Vitals reviewed.  Constitutional:      Appearance: Normal appearance. She is ill-appearing.  Cardiovascular:     Rate and Rhythm: Normal rate and regular rhythm.     Pulses: Normal pulses.     Heart sounds: Normal heart sounds.  Pulmonary:     Effort: Pulmonary effort is normal.     Breath sounds: Normal breath sounds.  Skin:    General: Skin is warm and dry.  Neurological:     General: No focal  deficit present.     Mental Status: She is alert and oriented to person, place, and time.  Psychiatric:        Mood and Affect: Mood normal.        Behavior: Behavior normal.      UC Treatments / Results  Labs (all labs ordered are listed, but only abnormal results are displayed) Labs Reviewed - No data to display  EKG   Radiology No results found.  Procedures Procedures (including critical care time)  Medications Ordered in UC Medications - No data to display  Initial Impression / Assessment and Plan / UC Course  I have reviewed the triage vital signs and the nursing notes.  Pertinent labs & imaging results that were available during my care of the patient were reviewed by me and considered in my medical decision making (see chart for details).   Patient is afebrile here without recent antipyretics. Satting well on room air. Overall is ill appearing, well hydrated, without respiratory distress. Pulmonary exam is unremarkable.  Lungs CTAB without wheezing, rhonchi, rales.  Symptoms are consistent with a viral process including influenza.  Since her symptoms within a 2-day treatment window, will treat presumptively for influenza A with Tamiflu.  Continue to recommend use of OTC medication for symptom control.  Final Clinical Impressions(s) / UC Diagnoses   Final diagnoses:  None   Discharge Instructions   None    ED Prescriptions   None    PDMP not reviewed this encounter.   Rose Phi, [place] 11/26/22 1913

## 2022-11-26 NOTE — Discharge Instructions (Addendum)
You have been diagnosed with a viral upper respiratory infection based on your symptoms and exam. Viral illnesses cannot be treated with antibiotics - they are self limiting - and you should find your symptoms resolving within a few days. Get plenty of rest and non-caffeinated fluids. Watch for signs of dehydration including reduced urine output and dark colored urine.  I have prescribed Tamiflu, antiviral therapy for influenza A, based on a presumptive diagnosis of influenza.    We recommend you use over-the-counter medications for symptom control including acetaminophen (Tylenol), ibuprofen (Advil/Motrin) or naproxen (Aleve) for fever, chills or body aches. You may combine use of acetaminophen and ibuprofen/naproxen if needed. Also recommend age-appropriate cold/cough medication, but please note that some cough medications are not recommended if you suffer from hypertension.    Saline mist spray is helpful for removing excess mucus from your nose.  Room humidifiers are helpful to ease breathing at night. I recommend guaifenesin (Mucinex) to help thin and loosen mucus secretions in your respiratory passages.   If appropriate based upon your other medical problems, you might also find relief of nasal/sinus congestion symptoms by using a nasal decongestant such as Flonase (fluticasone) or Sudafed sinus (pseudoephedrine).  You will need to obtain Sudafed from behind the pharmacist counter.  Speak to the pharmacist to verify that you are not duplicating medications with other over-the-counter formulations that you may be using.   Follow up here or with your primary care provider if your symptoms are worsening or not improving.

## 2023-02-17 ENCOUNTER — Encounter: Payer: Self-pay | Admitting: Emergency Medicine

## 2023-02-18 ENCOUNTER — Other Ambulatory Visit: Payer: Self-pay | Admitting: Internal Medicine

## 2023-02-18 DIAGNOSIS — Z1231 Encounter for screening mammogram for malignant neoplasm of breast: Secondary | ICD-10-CM

## 2023-02-24 ENCOUNTER — Ambulatory Visit
Admission: RE | Admit: 2023-02-24 | Discharge: 2023-02-24 | Disposition: A | Discharge status: Home / Self Care | Payer: BC Managed Care – PPO | Source: Ambulatory Visit | Attending: Internal Medicine | Admitting: Internal Medicine

## 2023-02-24 DIAGNOSIS — Z1231 Encounter for screening mammogram for malignant neoplasm of breast: Secondary | ICD-10-CM | POA: Diagnosis present

## 2023-03-10 ENCOUNTER — Other Ambulatory Visit: Payer: Self-pay | Admitting: Internal Medicine

## 2023-03-10 DIAGNOSIS — Z1231 Encounter for screening mammogram for malignant neoplasm of breast: Secondary | ICD-10-CM
